# Patient Record
Sex: Female | Born: 1980 | Race: White | Hispanic: No | Marital: Single | Smoking: Current every day smoker
Health system: Southern US, Community
[De-identification: ages and names within clinical notes are randomized; demographics above are authoritative.]

## PROBLEM LIST (undated history)

## (undated) DIAGNOSIS — R51 Headache: Secondary | ICD-10-CM

## (undated) HISTORY — PX: NO PAST SURGERIES: SHX2092

---
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
## 2002-07-29 ENCOUNTER — Inpatient Hospital Stay (HOSPITAL_COMMUNITY): Admission: AD | Admit: 2002-07-29 | Discharge: 2002-07-29 | Payer: Self-pay | Admitting: Obstetrics and Gynecology

## 2003-03-21 ENCOUNTER — Inpatient Hospital Stay (HOSPITAL_COMMUNITY): Admission: AD | Admit: 2003-03-21 | Discharge: 2003-03-21 | Payer: Self-pay | Admitting: Family Medicine

## 2003-10-30 ENCOUNTER — Emergency Department (HOSPITAL_COMMUNITY): Admission: EM | Admit: 2003-10-30 | Discharge: 2003-10-30 | Payer: Self-pay | Admitting: Emergency Medicine

## 2003-12-29 ENCOUNTER — Emergency Department (HOSPITAL_COMMUNITY): Admission: EM | Admit: 2003-12-29 | Discharge: 2003-12-29 | Payer: Self-pay | Admitting: *Deleted

## 2004-11-26 ENCOUNTER — Inpatient Hospital Stay (HOSPITAL_COMMUNITY): Admission: AD | Admit: 2004-11-26 | Discharge: 2004-11-26 | Payer: Self-pay | Admitting: Obstetrics and Gynecology

## 2007-04-23 ENCOUNTER — Inpatient Hospital Stay (HOSPITAL_COMMUNITY): Admission: AD | Admit: 2007-04-23 | Discharge: 2007-04-23 | Payer: Self-pay | Admitting: Gynecology

## 2009-03-29 ENCOUNTER — Inpatient Hospital Stay (HOSPITAL_COMMUNITY): Admission: AD | Admit: 2009-03-29 | Discharge: 2009-03-30 | Payer: Self-pay | Admitting: Obstetrics and Gynecology

## 2011-01-12 LAB — WET PREP, GENITAL: Trich, Wet Prep: NONE SEEN

## 2011-01-12 LAB — CBC
HCT: 42 % (ref 36.0–46.0)
MCHC: 35.1 g/dL (ref 30.0–36.0)
MCV: 91.1 fL (ref 78.0–100.0)
MCV: 92.3 fL (ref 78.0–100.0)
Platelets: 167 10*3/uL (ref 150–400)
Platelets: 178 10*3/uL (ref 150–400)
RDW: 14.3 % (ref 11.5–15.5)
WBC: 10.8 10*3/uL — ABNORMAL HIGH (ref 4.0–10.5)

## 2011-01-12 LAB — URINE MICROSCOPIC-ADD ON

## 2011-01-12 LAB — DIFFERENTIAL
Basophils Relative: 0 % (ref 0–1)
Eosinophils Absolute: 0 10*3/uL (ref 0.0–0.7)
Eosinophils Relative: 0 % (ref 0–5)
Neutrophils Relative %: 84 % — ABNORMAL HIGH (ref 43–77)

## 2011-01-12 LAB — URINALYSIS, ROUTINE W REFLEX MICROSCOPIC
Glucose, UA: 250 mg/dL — AB
Ketones, ur: 15 mg/dL — AB
Leukocytes, UA: NEGATIVE
Nitrite: NEGATIVE
Protein, ur: 100 mg/dL — AB
Specific Gravity, Urine: >1.03 — ABNORMAL HIGH (ref 1.005–1.030)
Urobilinogen, UA: >8 mg/dL — ABNORMAL HIGH (ref 0.0–1.0)
pH: 5.5 (ref 5.0–8.0)

## 2011-01-12 LAB — URINE CULTURE

## 2011-01-12 LAB — POCT PREGNANCY, URINE: Preg Test, Ur: NEGATIVE

## 2011-01-29 ENCOUNTER — Emergency Department (HOSPITAL_COMMUNITY)
Admission: EM | Admit: 2011-01-29 | Discharge: 2011-01-29 | Disposition: A | Discharge status: Home / Self Care | Payer: Self-pay | Attending: Emergency Medicine | Admitting: Emergency Medicine

## 2011-01-29 DIAGNOSIS — K089 Disorder of teeth and supporting structures, unspecified: Secondary | ICD-10-CM | POA: Insufficient documentation

## 2011-01-29 DIAGNOSIS — R220 Localized swelling, mass and lump, head: Secondary | ICD-10-CM | POA: Insufficient documentation

## 2011-02-16 ENCOUNTER — Other Ambulatory Visit (HOSPITAL_COMMUNITY): Payer: Self-pay | Admitting: Pediatrics

## 2011-02-16 ENCOUNTER — Other Ambulatory Visit (HOSPITAL_COMMUNITY): Payer: Self-pay

## 2011-02-17 NOTE — Discharge Summary (Signed)
Vanessa Beck, Vanessa Beck              ACCOUNT NO.:  [id number]   MEDICAL RECORD NO.:  [id number]          PATIENT TYPE:  INP   LOCATION:  9309                          FACILITY:  [facility]   PHYSICIAN:  Lesly Dukes, M.D. DATE OF BIRTH:  [birth year]   DATE OF ADMISSION:  03/29/2009  DATE OF DISCHARGE:  03/30/2009                               DISCHARGE SUMMARY   DISCHARGE DIAGNOSIS:  Viral syndrome with dehydration.   HOSPITAL COURSE:  The patient is a 30 year old female who presented with  low-grade fever, blood in her urine, malaise, abdominal pain.  The  patient was admitted on March 29, 2009, with low-grade fever, backache,  abdominal pain, malaise.  The patient had mild leukocytosis with a white  blood cell count 13,600, 15 ketones in her urine.  The patient was  admitted, given IV hydration and Phenergan.  The patient did very well.  Overnight, she has been afebrile since admission with T-max 99.5.  She  was tolerating food, ambulating, and urinating without discomfort.  She  has no back pain or abdominal pain.  The patient is not pregnant.   LABORATORY DATA:  White blood cell count on discharge 10.8.   PAST MEDICAL HISTORY:  None.   PAST SURGICAL HISTORY:  None.   OTHER TEST RESULTS:  Chest x-ray negative.  Wet prep, many clue cells  and few white blood cells.  Hemoglobin 14.7 and platelets 178.  UPT  negative.   DISCHARGE STATUS:  Improved.   DISCHARGE INSTRUCTIONS:  1. Rest over the weekend.  2. Keep hydrated.  3. Return to urgent care on Monday if not feeling well or fever      returns.  4. Tylenol for fever and pain.      Lesly Dukes, M.D.  Electronically Signed     KHL/MEDQ  D:  03/30/2009  T:  03/31/2009  Job:  [id number]

## 2011-03-26 ENCOUNTER — Emergency Department (HOSPITAL_COMMUNITY): Payer: Self-pay

## 2011-03-26 ENCOUNTER — Emergency Department (HOSPITAL_COMMUNITY)
Admission: EM | Admit: 2011-03-26 | Discharge: 2011-03-26 | Disposition: A | Discharge status: Home / Self Care | Payer: Self-pay | Attending: Emergency Medicine | Admitting: Emergency Medicine

## 2011-03-26 DIAGNOSIS — M79609 Pain in unspecified limb: Secondary | ICD-10-CM | POA: Insufficient documentation

## 2011-03-26 LAB — GLUCOSE, CAPILLARY: Glucose-Capillary: 130 mg/dL — ABNORMAL HIGH (ref 70–99)

## 2011-03-30 ENCOUNTER — Emergency Department (HOSPITAL_COMMUNITY): Payer: Self-pay

## 2011-03-30 ENCOUNTER — Emergency Department (HOSPITAL_COMMUNITY)
Admission: EM | Admit: 2011-03-30 | Discharge: 2011-03-30 | Disposition: A | Discharge status: Home / Self Care | Payer: Self-pay | Attending: Emergency Medicine | Admitting: Emergency Medicine

## 2011-03-30 DIAGNOSIS — M79609 Pain in unspecified limb: Secondary | ICD-10-CM | POA: Insufficient documentation

## 2011-03-30 DIAGNOSIS — M7750 Other enthesopathy of unspecified foot: Secondary | ICD-10-CM | POA: Insufficient documentation

## 2011-04-03 ENCOUNTER — Emergency Department (HOSPITAL_COMMUNITY)
Admission: EM | Admit: 2011-04-03 | Discharge: 2011-04-03 | Disposition: A | Discharge status: Nursing Home (Includes ICF) | Payer: Self-pay | Attending: Emergency Medicine | Admitting: Emergency Medicine

## 2011-04-03 DIAGNOSIS — M79609 Pain in unspecified limb: Secondary | ICD-10-CM | POA: Insufficient documentation

## 2011-04-03 DIAGNOSIS — M7750 Other enthesopathy of unspecified foot: Secondary | ICD-10-CM | POA: Insufficient documentation

## 2011-04-10 ENCOUNTER — Emergency Department (HOSPITAL_COMMUNITY)
Admission: EM | Admit: 2011-04-10 | Discharge: 2011-04-11 | Disposition: A | Discharge status: Home / Self Care | Payer: Self-pay | Attending: Emergency Medicine | Admitting: Emergency Medicine

## 2011-04-10 DIAGNOSIS — R413 Other amnesia: Secondary | ICD-10-CM

## 2011-04-10 DIAGNOSIS — R112 Nausea with vomiting, unspecified: Secondary | ICD-10-CM | POA: Insufficient documentation

## 2011-04-10 DIAGNOSIS — M79609 Pain in unspecified limb: Secondary | ICD-10-CM | POA: Insufficient documentation

## 2011-04-10 DIAGNOSIS — S069X9A Unspecified intracranial injury with loss of consciousness of unspecified duration, initial encounter: Secondary | ICD-10-CM

## 2011-04-10 DIAGNOSIS — F17200 Nicotine dependence, unspecified, uncomplicated: Secondary | ICD-10-CM | POA: Insufficient documentation

## 2011-04-10 LAB — URINALYSIS, ROUTINE W REFLEX MICROSCOPIC
Bilirubin Urine: NEGATIVE
Hgb urine dipstick: NEGATIVE
Ketones, ur: 40 mg/dL — AB
Nitrite: NEGATIVE
Protein, ur: NEGATIVE mg/dL
Specific Gravity, Urine: 1.022 (ref 1.005–1.030)
Urobilinogen, UA: 2 mg/dL — ABNORMAL HIGH (ref 0.0–1.0)

## 2011-04-10 LAB — POCT I-STAT, CHEM 8
Calcium, Ion: 1.07 mmol/L — ABNORMAL LOW (ref 1.12–1.32)
Chloride: 107 mEq/L (ref 96–112)
Glucose, Bld: 113 mg/dL — ABNORMAL HIGH (ref 70–99)
HCT: 45 % (ref 36.0–46.0)
Hemoglobin: 15.3 g/dL — ABNORMAL HIGH (ref 12.0–15.0)
TCO2: 18 mmol/L (ref 0–100)

## 2011-04-10 LAB — POCT PREGNANCY, URINE: Preg Test, Ur: NEGATIVE

## 2011-04-15 ENCOUNTER — Emergency Department (HOSPITAL_COMMUNITY)
Admission: EM | Admit: 2011-04-15 | Discharge: 2011-04-16 | Discharge status: Against Medical Advice | Payer: Self-pay | Attending: Emergency Medicine | Admitting: Emergency Medicine

## 2011-07-20 LAB — URINALYSIS, ROUTINE W REFLEX MICROSCOPIC
Bilirubin Urine: NEGATIVE
Glucose, UA: NEGATIVE
Ketones, ur: 15 — AB
Nitrite: NEGATIVE
Specific Gravity, Urine: 1.025
pH: 7

## 2011-07-20 LAB — WET PREP, GENITAL
Trich, Wet Prep: NONE SEEN
Yeast Wet Prep HPF POC: NONE SEEN

## 2011-07-20 LAB — URINE MICROSCOPIC-ADD ON

## 2011-07-20 LAB — GC/CHLAMYDIA PROBE AMP, GENITAL: GC Probe Amp, Genital: NEGATIVE

## 2012-05-17 ENCOUNTER — Encounter (HOSPITAL_COMMUNITY): Payer: Self-pay | Admitting: *Deleted

## 2012-05-17 ENCOUNTER — Emergency Department (HOSPITAL_COMMUNITY)
Admission: EM | Admit: 2012-05-17 | Discharge: 2012-05-17 | Disposition: A | Discharge status: Home / Self Care | Payer: Self-pay | Attending: Emergency Medicine | Admitting: Emergency Medicine

## 2012-05-17 DIAGNOSIS — F17200 Nicotine dependence, unspecified, uncomplicated: Secondary | ICD-10-CM | POA: Insufficient documentation

## 2012-05-17 DIAGNOSIS — K047 Periapical abscess without sinus: Secondary | ICD-10-CM | POA: Insufficient documentation

## 2012-05-17 MED ORDER — PENICILLIN V POTASSIUM 500 MG PO TABS: 500.0000 mg | ORAL_TABLET | Freq: Four times a day (QID) | ORAL | Status: AC

## 2012-05-17 MED ORDER — HYDROCODONE-ACETAMINOPHEN 5-325 MG PO TABS: 2.0000 | ORAL_TABLET | ORAL | Status: AC | PRN

## 2012-05-17 NOTE — ED Provider Notes (Signed)
History    This chart was scribed for Rolan Bucco, MD, MD by Smitty Pluck. The patient was seen in room TR05C and the patient's care was started at 3:03PM.   CSN: [id number]  Arrival date & time 05/17/12  1233   First MD Initiated Contact with Patient 05/17/12 1455      Chief Complaint  Patient presents with  . Dental Pain    (Consider location/radiation/quality/duration/timing/severity/associated sxs/prior treatment) Patient is a 31 y.o. female presenting with tooth pain. The history is provided by the patient.  Dental PainPrimary symptoms do not include fever.   Vanessa Beck is a 31 y.o. female who presents to the Emergency Department complaining of constant, moderate bottom right dental pain onset 1 day ago. Pt reports that she thinks it is her wisdom teeth coming in. Denies any other pain. Denies fevers, chills and n/v/d.   History reviewed. No pertinent past medical history.  History reviewed. No pertinent past surgical history.  No family history on file.  History  Substance Use Topics  . Smoking status: Current Everyday Smoker  . Smokeless tobacco: Not on file  . Alcohol Use: No    OB History    Grav Para Term Preterm Abortions TAB SAB Ect Mult Living                  Review of Systems  Constitutional: Negative for fever and chills.  HENT: Positive for dental problem.   Gastrointestinal: Negative for nausea and vomiting.  Neurological: Negative for syncope and weakness.    Allergies  Review of patient's allergies indicates no known allergies.  Home Medications   Current Outpatient Rx  Name Route Sig Dispense Refill  . ACETAMINOPHEN 325 MG PO TABS Oral Take 650 mg by mouth every 6 (six) hours as needed. For pain    . HYDROCODONE-ACETAMINOPHEN 5-325 MG PO TABS Oral Take 2 tablets by mouth every 4 (four) hours as needed for pain. 15 tablet 0  . PENICILLIN V POTASSIUM 500 MG PO TABS Oral Take 1 tablet (500 mg total) by mouth 4 (four) times daily. 40  tablet 0    BP 136/94  Pulse 77  Temp 98.5 F (36.9 C) (Oral)  Resp 18  SpO2 100%  LMP 05/17/2012  Physical Exam  Nursing note and vitals reviewed. Constitutional: She is oriented to person, place, and time. She appears well-developed and well-nourished.  HENT:  Beck: Normocephalic and atraumatic.       Mild swelling and tenderness lower back right molar  No trismus   Neck: Neck supple. No tracheal deviation present.  Pulmonary/Chest: Effort normal. No respiratory distress.  Neurological: She is alert and oriented to person, place, and time.  Skin: Skin is warm and dry.  Psychiatric: She has a normal mood and affect. Her behavior is normal.    ED Course  Procedures (including critical care time) DIAGNOSTIC STUDIES: Oxygen Saturation is 100% on room air, normal by my interpretation.    COORDINATION OF CARE: 3:06PM EDP discusses pt ED treatment with pt     Labs Reviewed - No data to display No results found.   1. Periapical abscess       MDM  Pt given abx, pain meds, encouraged to f/u with dentist      I personally performed the services described in this documentation, which was scribed in my presence.  The recorded information has been reviewed and considered.    Rolan Bucco, MD 05/17/12 2009

## 2012-05-17 NOTE — ED Notes (Signed)
Pt is here with right lower mouth/teeth pain and thinks her wisdom teeth are coming in

## 2012-11-03 ENCOUNTER — Encounter (HOSPITAL_COMMUNITY): Payer: Self-pay

## 2012-11-03 ENCOUNTER — Emergency Department (HOSPITAL_COMMUNITY): Payer: Self-pay

## 2012-11-03 ENCOUNTER — Emergency Department (HOSPITAL_COMMUNITY)
Admission: EM | Admit: 2012-11-03 | Discharge: 2012-11-03 | Disposition: A | Discharge status: Home / Self Care | Payer: Self-pay | Attending: Emergency Medicine | Admitting: Emergency Medicine

## 2012-11-03 DIAGNOSIS — S0100XA Unspecified open wound of scalp, initial encounter: Secondary | ICD-10-CM | POA: Insufficient documentation

## 2012-11-03 DIAGNOSIS — Y92410 Unspecified street and highway as the place of occurrence of the external cause: Secondary | ICD-10-CM | POA: Insufficient documentation

## 2012-11-03 DIAGNOSIS — S0101XA Laceration without foreign body of scalp, initial encounter: Secondary | ICD-10-CM

## 2012-11-03 DIAGNOSIS — F17200 Nicotine dependence, unspecified, uncomplicated: Secondary | ICD-10-CM | POA: Insufficient documentation

## 2012-11-03 DIAGNOSIS — Y9389 Activity, other specified: Secondary | ICD-10-CM | POA: Insufficient documentation

## 2012-11-03 MED ORDER — IBUPROFEN 600 MG PO TABS: 600.0000 mg | ORAL_TABLET | Freq: Four times a day (QID) | ORAL | Status: DC | PRN
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
## 2012-11-03 NOTE — ED Notes (Signed)
NP at bedside.
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
## 2012-11-03 NOTE — ED Notes (Signed)
Pt yelling on phone. No longer seems lethargic. Family at bedside.

## 2012-11-05 NOTE — ED Provider Notes (Signed)
Medical screening examination/treatment/procedure(s) were performed by non-physician practitioner and as supervising physician I was immediately available for consultation/collaboration.   Gavin Pound. Oletta Lamas, MD 11/05/12 [phone]

## 2013-06-14 ENCOUNTER — Emergency Department (HOSPITAL_COMMUNITY)
Admission: EM | Admit: 2013-06-14 | Discharge: 2013-06-14 | Discharge status: Against Medical Advice | Payer: Self-pay | Attending: Emergency Medicine | Admitting: Emergency Medicine

## 2013-06-14 ENCOUNTER — Encounter (HOSPITAL_COMMUNITY): Payer: Self-pay | Admitting: Emergency Medicine

## 2013-06-14 DIAGNOSIS — M545 Low back pain, unspecified: Secondary | ICD-10-CM | POA: Insufficient documentation

## 2013-06-14 DIAGNOSIS — F17200 Nicotine dependence, unspecified, uncomplicated: Secondary | ICD-10-CM | POA: Insufficient documentation

## 2013-06-14 NOTE — ED Notes (Addendum)
Pt c/o severe lower back pain since Monday. Pt reports she is unable to ambulate.  Pt is tearful in triage. Denies injury or urinary symptoms.

## 2013-06-15 ENCOUNTER — Inpatient Hospital Stay (HOSPITAL_COMMUNITY)
Admission: AD | Admit: 2013-06-15 | Discharge: 2013-06-15 | Disposition: A | Discharge status: Home / Self Care | Payer: Self-pay | Source: Ambulatory Visit | Attending: Obstetrics & Gynecology | Admitting: Obstetrics & Gynecology

## 2013-06-15 ENCOUNTER — Encounter (HOSPITAL_COMMUNITY): Payer: Self-pay | Admitting: *Deleted

## 2013-06-15 DIAGNOSIS — R1084 Generalized abdominal pain: Secondary | ICD-10-CM

## 2013-06-15 DIAGNOSIS — R109 Unspecified abdominal pain: Secondary | ICD-10-CM | POA: Insufficient documentation

## 2013-06-15 DIAGNOSIS — S39012A Strain of muscle, fascia and tendon of lower back, initial encounter: Secondary | ICD-10-CM

## 2013-06-15 DIAGNOSIS — M549 Dorsalgia, unspecified: Secondary | ICD-10-CM | POA: Insufficient documentation

## 2013-06-15 DIAGNOSIS — M62838 Other muscle spasm: Secondary | ICD-10-CM | POA: Insufficient documentation

## 2013-06-15 DIAGNOSIS — M545 Low back pain: Secondary | ICD-10-CM

## 2013-06-15 HISTORY — DX: Headache: R51

## 2013-06-15 LAB — URINALYSIS, ROUTINE W REFLEX MICROSCOPIC
Bilirubin Urine: NEGATIVE
Hgb urine dipstick: NEGATIVE
Nitrite: NEGATIVE
Specific Gravity, Urine: <1.005 — ABNORMAL LOW (ref 1.005–1.030)
Urobilinogen, UA: 0.2 mg/dL (ref 0.0–1.0)
pH: 6.5 (ref 5.0–8.0)

## 2013-06-15 LAB — URINE MICROSCOPIC-ADD ON

## 2013-06-15 MED ORDER — CYCLOBENZAPRINE HCL 10 MG PO TABS: 10.0000 mg | ORAL_TABLET | Freq: Three times a day (TID) | ORAL | Status: DC | PRN

## 2013-06-15 NOTE — MAU Provider Note (Cosign Needed)
History     CSN: [id number]  Arrival date and time: 06/15/13 1158   None     Chief Complaint  Patient presents with  . Back Pain  . Abdominal Pain   HPI Vanessa Beck is a 32yo F who presents to the MAU with severe back pain over the past 4 days. This is described as a constant "pressure" that involves the lower back (L>R), abdomen, and thighs. The pain is worse with movement, standing, laughter. Tylenol failed to relieve the pain, though a heating pad helped some. She denies recent illnesses, trauma, and has never had this before; however, did describe having picked up her 33 year-old nephew the day before.  Past Medical History  Diagnosis Date  . UVOZDGUY(403.4)     Past Surgical History  Procedure Laterality Date  . No past surgeries      Family History  Problem Relation Age of Onset  . Hypertension Mother   . Diabetes Father     History  Substance Use Topics  . Smoking status: Current Every Day Smoker -- 0.50 packs/day  . Smokeless tobacco: Not on file  . Alcohol Use: No    Allergies: No Known Allergies  Prescriptions prior to admission  Medication Sig Dispense Refill  . ibuprofen (ADVIL,MOTRIN) 600 MG tablet Take 1 tablet (600 mg total) by mouth every 6 (six) hours as needed for pain.  20 tablet  0    Review of Systems  Constitutional: Negative for fever, chills and diaphoresis.  Eyes: Negative for blurred vision.  Respiratory: Negative for shortness of breath.   Cardiovascular: Negative for chest pain.  Gastrointestinal: Negative for nausea, vomiting, abdominal pain, diarrhea and constipation.  Genitourinary: Negative for dysuria, urgency, frequency and hematuria.  Musculoskeletal: Positive for back pain (lower (L>R)).   Physical Exam   Blood pressure 129/71, pulse 71, temperature 98.1 F (36.7 C), temperature source Oral, resp. rate 18, height 5\' 5"  (1.651 m), weight 87.091 kg (192 lb), last menstrual period 06/02/2013, SpO2 99.00%.  Physical  Exam  Constitutional: She appears well-developed and well-nourished. She appears distressed.  GI: Soft. She exhibits no mass. There is tenderness (mild TTP LLQ & LRQ). There is no guarding.  Musculoskeletal: She exhibits tenderness (TTP lower left back. ).  Patient responded well to manual manipulation of the spine.   MAU Course  Procedures  Results for orders placed during the hospital encounter of 06/15/13 (from the past 24 hour(s))  URINALYSIS, ROUTINE W REFLEX MICROSCOPIC     Status: Abnormal   Collection Time    06/15/13 12:15 PM      Result Value Range   Color, Urine YELLOW  YELLOW   APPearance CLEAR  CLEAR   Specific Gravity, Urine <1.005 (*) 1.005 - 1.030   pH 6.5  5.0 - 8.0   Glucose, UA NEGATIVE  NEGATIVE mg/dL   Hgb urine dipstick NEGATIVE  NEGATIVE   Bilirubin Urine NEGATIVE  NEGATIVE   Ketones, ur NEGATIVE  NEGATIVE mg/dL   Protein, ur NEGATIVE  NEGATIVE mg/dL   Urobilinogen, UA 0.2  0.0 - 1.0 mg/dL   Nitrite NEGATIVE  NEGATIVE   Leukocytes, UA SMALL (*) NEGATIVE  URINE MICROSCOPIC-ADD ON     Status: Abnormal   Collection Time    06/15/13 12:15 PM      Result Value Range   Squamous Epithelial / LPF FEW (*) RARE   WBC, UA 0-2  <3 WBC/hpf   Bacteria, UA RARE  RARE  POCT PREGNANCY, URINE  Status: None   Collection Time    06/15/13 12:33 PM      Result Value Range   Preg Test, Ur NEGATIVE  NEGATIVE     MDM: With her history of lifting up her 60-lb nephew the day before pain, alongside her musculoskeletal-pointing physical exam, I suspect spasm of the paraspinal muscles to be the most likely diagnosis.    Assessment and Plan  *Muscle Spasm -Begin Flexeril -Traction/counter traction Manipulation to spine -Patient educated on proper body mechanics, and how to take care of her back in the near and long term.  Vanessa Beck 06/15/2013, 1:32 PM   I spoke with and examined patient and agree with PA-S's note and plan of care.  Vanessa Scale,  MD Ob Fellow 06/15/2013 7:42 PM

## 2013-06-15 NOTE — MAU Note (Signed)
When pt was called back to a MAU room, she was found outside smoking and talking  on her phone;

## 2013-06-15 NOTE — MAU Note (Signed)
C/o back pain since Monday (past 4 days)- pain started at work ([employer]) but denies any injury or heavy lifting; c/o abd pain also that started with the back pain;

## 2013-06-15 NOTE — MAU Note (Signed)
Patient states she has had increasing back pain for several days. Was at [facility] last night but did not wait to be seen by MD. States she is having abdominal pain as well. Denies nausea,vomiting,vaginal bleeding or discharge.

## 2014-03-15 ENCOUNTER — Emergency Department (HOSPITAL_COMMUNITY)
Admission: EM | Admit: 2014-03-15 | Discharge: 2014-03-15 | Disposition: A | Discharge status: Home / Self Care | Payer: Self-pay | Attending: Emergency Medicine | Admitting: Emergency Medicine

## 2014-03-15 ENCOUNTER — Encounter (HOSPITAL_COMMUNITY): Payer: Self-pay | Admitting: Emergency Medicine

## 2014-03-15 ENCOUNTER — Emergency Department (HOSPITAL_COMMUNITY): Payer: Self-pay

## 2014-03-15 DIAGNOSIS — J40 Bronchitis, not specified as acute or chronic: Secondary | ICD-10-CM

## 2014-03-15 DIAGNOSIS — J209 Acute bronchitis, unspecified: Secondary | ICD-10-CM | POA: Insufficient documentation

## 2014-03-15 DIAGNOSIS — F17200 Nicotine dependence, unspecified, uncomplicated: Secondary | ICD-10-CM | POA: Insufficient documentation

## 2014-03-15 MED ORDER — CETIRIZINE-PSEUDOEPHEDRINE ER 5-120 MG PO TB12: 1.0000 | ORAL_TABLET | Freq: Two times a day (BID) | ORAL | Status: DC

## 2014-03-15 MED ORDER — GUAIFENESIN ER 600 MG PO TB12: 600.0000 mg | ORAL_TABLET | Freq: Two times a day (BID) | ORAL | Status: DC

## 2014-03-15 MED ORDER — ALBUTEROL SULFATE (2.5 MG/3ML) 0.083% IN NEBU
5.0000 mg | INHALATION_SOLUTION | Freq: Once | RESPIRATORY_TRACT | Status: AC
  Administered 2014-03-15: 5 mg via RESPIRATORY_TRACT
  Filled 2014-03-15: qty 6

## 2014-03-15 MED ORDER — AZITHROMYCIN 250 MG PO TABS: ORAL_TABLET | ORAL | Status: DC

## 2014-03-15 MED ORDER — PREDNISONE 20 MG PO TABS
60.0000 mg | ORAL_TABLET | Freq: Once | ORAL | Status: AC
  Administered 2014-03-15: 60 mg via ORAL
  Filled 2014-03-15: qty 3

## 2014-03-15 MED ORDER — ALBUTEROL SULFATE HFA 108 (90 BASE) MCG/ACT IN AERS
2.0000 | INHALATION_SPRAY | RESPIRATORY_TRACT | Status: DC | PRN
  Administered 2014-03-15: 2 via RESPIRATORY_TRACT
  Filled 2014-03-15: qty 6.7

## 2014-03-15 MED ORDER — PREDNISONE 10 MG PO TABS: 20.0000 mg | ORAL_TABLET | Freq: Every day | ORAL | Status: DC

## 2014-03-15 NOTE — Discharge Instructions (Signed)
Please use the medications prescribed to help with your infection and symptoms. Use the albuterol inhaler as instructed by taking 2 puffs every 4 hours as needed for cough and wheezing. Followup with a primary care provider for continued evaluation and treatment.     Bronchitis Bronchitis is inflammation of the airways that extend from the windpipe into the lungs (bronchi). The inflammation often causes mucus to develop, which leads to a cough. If the inflammation becomes severe, it may cause shortness of breath. CAUSES  Bronchitis may be caused by:   Viral infections.   Bacteria.   Cigarette smoke.   Allergens, pollutants, and other irritants.  SIGNS AND SYMPTOMS  The most common symptom of bronchitis is a frequent cough that produces mucus. Other symptoms include:  Fever.   Body aches.   Chest congestion.   Chills.   Shortness of breath.   Sore throat.  DIAGNOSIS  Bronchitis is usually diagnosed through a medical history and physical exam. Tests, such as chest X-rays, are sometimes done to rule out other conditions.  TREATMENT  You may need to avoid contact with whatever caused the problem (smoking, for example). Medicines are sometimes needed. These may include:  Antibiotics. These may be prescribed if the condition is caused by bacteria.  Cough suppressants. These may be prescribed for relief of cough symptoms.   Inhaled medicines. These may be prescribed to help open your airways and make it easier for you to breathe.   Steroid medicines. These may be prescribed for those with recurrent (chronic) bronchitis. HOME CARE INSTRUCTIONS  Get plenty of rest.   Drink enough fluids to keep your urine clear or pale yellow (unless you have a medical condition that requires fluid restriction). Increasing fluids may help thin your secretions and will prevent dehydration.   Only take over-the-counter or prescription medicines as directed by your health care  provider.  Only take antibiotics as directed. Make sure you finish them even if you start to feel better.  Avoid secondhand smoke, irritating chemicals, and strong fumes. These will make bronchitis worse. If you are a smoker, quit smoking. Consider using nicotine gum or skin patches to help control withdrawal symptoms. Quitting smoking will help your lungs heal faster.   Put a cool-mist humidifier in your bedroom at night to moisten the air. This may help loosen mucus. Change the water in the humidifier daily. You can also run the hot water in your shower and sit in the bathroom with the door closed for 5 10 minutes.   Follow up with your health care provider as directed.   Wash your hands frequently to avoid catching bronchitis again or spreading an infection to others.  SEEK MEDICAL CARE IF: Your symptoms do not improve after 1 week of treatment.  SEEK IMMEDIATE MEDICAL CARE IF:  Your fever increases.  You have chills.   You have chest pain.   You have worsening shortness of breath.   You have bloody sputum.  You faint.  You have lightheadedness.  You have a severe headache.   You vomit repeatedly. MAKE SURE YOU:   Understand these instructions.  Will watch your condition.  Will get help right away if you are not doing well or get worse. Document Released: 09/21/2005 Document Revised: 07/12/2013 Document Reviewed: 05/16/2013 [facility] Patient Information 2014 [place], [place].

## 2014-03-15 NOTE — ED Notes (Signed)
Declined W/C at D/C and was escorted to lobby by RN. 

## 2014-03-15 NOTE — ED Notes (Signed)
Pt transported to Xray. 

## 2014-03-15 NOTE — ED Provider Notes (Signed)
CSN: [id number]     Arrival date & time 03/15/14  1641 History  This chart was scribed for Ivonne Andrew, PA working with Vanetta Mulders, MD by Evon Slack, ED Scribe. This patient was seen in room TR11C/TR11C and the patient's care was started at 6:30 PM.      Chief Complaint  Patient presents with  . URI   The history is provided by the patient. No language interpreter was used.   HPI Comments: Vanessa Beck is a 33 y.o. female who presents to the Emergency Department complaining of uri onset 4 days. She states she has associated fever, cough productive of green sputum, fatigue, chills, rhinorrhea, ear pain. She states she has taken ibuprofen with no improvement of her symptoms. States she has recently been in contact with her sick sister and co workers. She denies any other related symptoms. She states that she is a current smoker of .50 ppd. She denies having a h/o of respiratory issues.     Past Medical History  Diagnosis Date  . JASNKNLZ(767.3)    Past Surgical History  Procedure Laterality Date  . No past surgeries     Family History  Problem Relation Age of Onset  . Hypertension Mother   . Diabetes Father    History  Substance Use Topics  . Smoking status: Current Every Day Smoker -- 0.50 packs/day  . Smokeless tobacco: Not on file  . Alcohol Use: No   OB History   Grav Para Term Preterm Abortions TAB SAB Ect Mult Living   1 1 1       1      Review of Systems  Constitutional: Positive for chills.  All other systems reviewed and are negative.     Allergies  Review of patient's allergies indicates no known allergies.  Home Medications   Prior to Admission medications   Medication Sig Start Date End Date Taking? Authorizing Provider  Acetaminophen (TYLENOL PO) Take 2 tablets by mouth every 8 (eight) hours as needed (takes for pain).    Historical Provider, MD  cyclobenzaprine (FLEXERIL) 10 MG tablet Take 1 tablet (10 mg total) by mouth 3 (three) times  daily as needed for muscle spasms. 06/15/13   Lesly Dukes, MD   Triage Vitals: BP 131/88  Pulse 86  Temp(Src) 98.3 F (36.8 C) (Oral)  Resp 20  SpO2 98%  LMP 03/14/2014  Physical Exam  Nursing note and vitals reviewed. Constitutional: She is oriented to person, place, and time. She appears well-developed and well-nourished. No distress.  HENT:  Head: Normocephalic and atraumatic.  Right Ear: Tympanic membrane normal.  Left Ear: Tympanic membrane normal.  Mouth/Throat: Oropharynx is clear and moist.  Eyes: Conjunctivae and EOM are normal. Pupils are equal, round, and reactive to light.  Neck: Normal range of motion. No tracheal deviation present.  Cardiovascular: Normal rate, regular rhythm and normal heart sounds.   Pulmonary/Chest: Effort normal. No respiratory distress. She has wheezes. She has no rales.  Musculoskeletal: Normal range of motion.  Neurological: She is alert and oriented to person, place, and time.  Skin: Skin is warm and dry.  Psychiatric: She has a normal mood and affect. Her behavior is normal.    ED Course  Procedures    DIAGNOSTIC STUDIES: Oxygen Saturation is 98% on RA, normal by my interpretation.    COORDINATION OF CARE: 6:38 PM-Discussed treatment plan which includes nebulizer treatment and albuterol inhaler with pt at bedside and pt agreed to plan.  Imaging Review Dg Chest 2 View  03/15/2014   CLINICAL DATA:  Productive cough.  Headache.  EXAM: CHEST  2 VIEW  COMPARISON:  03/30/2009.  FINDINGS: Cardiopericardial silhouette within normal limits. Mediastinal contours normal. Trachea midline. No airspace disease or effusion.  IMPRESSION: No active cardiopulmonary disease.   Electronically Signed   By: Andreas NewportGeoffrey  Lamke M.D.   On: 03/15/2014 18:27      MDM   Final diagnoses:  Bronchitis      I personally performed the services described in this documentation, which was scribed in my presence. The recorded information has been reviewed  and is accurate.     Angus Sellereter S Vicky Mccanless, PA-C 03/15/14 1844

## 2014-03-15 NOTE — ED Notes (Addendum)
Pt c/o rhinitis, cough, fever and ear pain since Sunday.  Pt talking constantly on phone and calling someone a "bitch".  Pt took ibuprofen at 10 am approx.

## 2014-03-15 NOTE — ED Notes (Signed)
Pt tearful during nursing assessment.  Pt reports coughing and runny nose since Sunday.  Reports coughing up green sputum.  Reports fevers.  Does not recall temperature.

## 2014-03-17 NOTE — ED Provider Notes (Signed)
Medical screening examination/treatment/procedure(s) were performed by non-physician practitioner and as supervising physician I was immediately available for consultation/collaboration.   EKG Interpretation None        Archana Eckman, MD 03/17/14 1613 

## 2014-08-06 ENCOUNTER — Encounter (HOSPITAL_COMMUNITY): Payer: Self-pay | Admitting: Emergency Medicine

## 2014-10-01 ENCOUNTER — Inpatient Hospital Stay (HOSPITAL_COMMUNITY)
Admission: AD | Admit: 2014-10-01 | Discharge: 2014-10-02 | Disposition: A | Discharge status: Home / Self Care | Payer: Self-pay | Source: Ambulatory Visit | Attending: Obstetrics & Gynecology | Admitting: Obstetrics & Gynecology

## 2014-10-01 ENCOUNTER — Encounter (HOSPITAL_COMMUNITY): Payer: Self-pay | Admitting: *Deleted

## 2014-10-01 DIAGNOSIS — R1032 Left lower quadrant pain: Secondary | ICD-10-CM | POA: Insufficient documentation

## 2014-10-01 DIAGNOSIS — F17210 Nicotine dependence, cigarettes, uncomplicated: Secondary | ICD-10-CM | POA: Insufficient documentation

## 2014-10-01 DIAGNOSIS — N939 Abnormal uterine and vaginal bleeding, unspecified: Secondary | ICD-10-CM | POA: Insufficient documentation

## 2014-10-01 DIAGNOSIS — R102 Pelvic and perineal pain: Secondary | ICD-10-CM

## 2014-10-01 DIAGNOSIS — N8320 Unspecified ovarian cysts: Secondary | ICD-10-CM | POA: Insufficient documentation

## 2014-10-01 LAB — POCT PREGNANCY, URINE: Preg Test, Ur: NEGATIVE
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
## 2014-10-02 ENCOUNTER — Inpatient Hospital Stay (HOSPITAL_COMMUNITY): Payer: Self-pay

## 2014-10-02 ENCOUNTER — Encounter (HOSPITAL_COMMUNITY): Payer: Self-pay | Admitting: *Deleted

## 2014-10-02 DIAGNOSIS — R102 Pelvic and perineal pain: Secondary | ICD-10-CM

## 2014-10-02 LAB — CBC WITH DIFFERENTIAL/PLATELET
BASOS ABS: 0 10*3/uL (ref 0.0–0.1)
BASOS PCT: 0 % (ref 0–1)
EOS ABS: 0 10*3/uL (ref 0.0–0.7)
EOS PCT: 0 % (ref 0–5)
HCT: 40.4 % (ref 36.0–46.0)
Hemoglobin: 14 g/dL (ref 12.0–15.0)
Lymphocytes Relative: 15 % (ref 12–46)
Lymphs Abs: 2 10*3/uL (ref 0.7–4.0)
MCH: 32.7 pg (ref 26.0–34.0)
MCHC: 34.7 g/dL (ref 30.0–36.0)
MCV: 94.4 fL (ref 78.0–100.0)
Monocytes Absolute: 0.6 10*3/uL (ref 0.1–1.0)
Monocytes Relative: 4 % (ref 3–12)
Neutro Abs: 10.6 10*3/uL — ABNORMAL HIGH (ref 1.7–7.7)
Neutrophils Relative %: 81 % — ABNORMAL HIGH (ref 43–77)
PLATELETS: 296 10*3/uL (ref 150–400)
RBC: 4.28 MIL/uL (ref 3.87–5.11)
RDW: 13.6 % (ref 11.5–15.5)
WBC: 13.2 10*3/uL — ABNORMAL HIGH (ref 4.0–10.5)

## 2014-10-02 LAB — URINALYSIS, DIPSTICK ONLY
BILIRUBIN URINE: NEGATIVE
GLUCOSE, UA: NEGATIVE mg/dL
KETONES UR: NEGATIVE mg/dL
Nitrite: NEGATIVE
PH: 6 (ref 5.0–8.0)
Protein, ur: 30 mg/dL — AB
Specific Gravity, Urine: 1.015 (ref 1.005–1.030)
Urobilinogen, UA: 0.2 mg/dL (ref 0.0–1.0)

## 2014-10-02 LAB — GC/CHLAMYDIA PROBE AMP
CT PROBE, AMP APTIMA: NEGATIVE
GC PROBE AMP APTIMA: POSITIVE — AB

## 2014-10-02 LAB — HIV ANTIBODY (ROUTINE TESTING W REFLEX): HIV: NONREACTIVE

## 2014-10-02 LAB — WET PREP, GENITAL
Trich, Wet Prep: NONE SEEN
YEAST WET PREP: NONE SEEN

## 2014-10-02 MED ORDER — MEGESTROL ACETATE 40 MG PO TABS: ORAL_TABLET | ORAL | Status: DC

## 2014-10-02 MED ORDER — KETOROLAC TROMETHAMINE 60 MG/2ML IM SOLN
60.0000 mg | Freq: Once | INTRAMUSCULAR | Status: AC
  Administered 2014-10-02: 60 mg via INTRAMUSCULAR
  Filled 2014-10-02: qty 2

## 2014-10-02 MED ORDER — METRONIDAZOLE 500 MG PO TABS: 500.0000 mg | ORAL_TABLET | Freq: Two times a day (BID) | ORAL | Status: DC

## 2014-10-02 MED ORDER — OXYCODONE-ACETAMINOPHEN 5-325 MG PO TABS: 1.0000 | ORAL_TABLET | Freq: Four times a day (QID) | ORAL | Status: DC | PRN

## 2014-10-02 MED ORDER — DOXYCYCLINE HYCLATE 100 MG PO CAPS: 100.0000 mg | ORAL_CAPSULE | Freq: Two times a day (BID) | ORAL | Status: DC

## 2014-10-02 MED ORDER — HYDROMORPHONE HCL 1 MG/ML IJ SOLN
1.0000 mg | Freq: Once | INTRAMUSCULAR | Status: AC
  Administered 2014-10-02: 1 mg via INTRAMUSCULAR
  Filled 2014-10-02: qty 1

## 2014-10-02 NOTE — Discharge Instructions (Signed)
Abnormal Uterine Bleeding Abnormal uterine bleeding can affect women at various stages in life, including teenagers, women in their reproductive years, pregnant women, and women who have reached menopause. Several kinds of uterine bleeding are considered abnormal, including:  Bleeding or spotting between periods.   Bleeding after sexual intercourse.   Bleeding that is heavier or more than normal.   Periods that last longer than usual.  Bleeding after menopause.  Many cases of abnormal uterine bleeding are minor and simple to treat, while others are more serious. Any type of abnormal bleeding should be evaluated by your health care provider. Treatment will depend on the cause of the bleeding. HOME CARE INSTRUCTIONS Monitor your condition for any changes. The following actions may help to alleviate any discomfort you are experiencing:  Avoid the use of tampons and douches as directed by your health care provider.  Change your pads frequently. You should get regular pelvic exams and Pap tests. Keep all follow-up appointments for diagnostic tests as directed by your health care provider.  SEEK MEDICAL CARE IF:   Your bleeding lasts more than 1 week.   You feel dizzy at times.  SEEK IMMEDIATE MEDICAL CARE IF:   You pass out.   You are changing pads every 15 to 30 minutes.   You have abdominal pain.  You have a fever.   You become sweaty or weak.   You are passing large blood clots from the vagina.   You start to feel nauseous and vomit. MAKE SURE YOU:   Understand these instructions.  Will watch your condition.  Will get help right away if you are not doing well or get worse. Document Released: 09/21/2005 Document Revised: 09/26/2013 Document Reviewed: 04/20/2013 Exit[employer] Patient Information 2015 [employer]. This information is not intended to replace advice given to you by your health care provider. Make sure you discuss any questions you have with your  health care provider.  

## 2014-10-02 NOTE — MAU Provider Note (Signed)
History     CSN: [id number]  Arrival date and time: 10/01/14 2319   First Provider Initiated Contact with Patient 10/02/14 0015      No chief complaint on file.  HPI  Ms. Vanessa Beck is a 33 y.o. G1P1001 who presents to MAU today with complaint of LLQ pain and vaginal bleeding. The patient states normal LMP 09/17/14. She states that she noted constipation recently and took a laxative yesterday. She had a normal BM after that. Overnight last night she noted new onset vaginal bleeding. She states that the bleeding has been heavy. She also has LLQ pain that she rates at 8/10 now. She denies N/V/D or fever. She is not on birth control. She denies a history of irregular periods.   OB History    Gravida Para Term Preterm AB TAB SAB Ectopic Multiple Living   1 1 1       1       Past Medical History  Diagnosis Date  . WUJWJXBJ(478.2)     Past Surgical History  Procedure Laterality Date  . No past surgeries      Family History  Problem Relation Age of Onset  . Hypertension Mother   . Diabetes Father     History  Substance Use Topics  . Smoking status: Current Every Day Smoker -- 0.50 packs/day  . Smokeless tobacco: Not on file  . Alcohol Use: Yes    Allergies: No Known Allergies  Prescriptions prior to admission  Medication Sig Dispense Refill Last Dose  . acetaminophen (TYLENOL) 325 MG tablet Take 650 mg by mouth every 4 (four) hours as needed.     Marland Kitchen azithromycin (ZITHROMAX Z-PAK) 250 MG tablet Take 2 tabs on day one. Take 1 tab on days 2-5 6 tablet 0   . cetirizine-pseudoephedrine (ZYRTEC-D) 5-120 MG per tablet Take 1 tablet by mouth 2 (two) times daily. 30 tablet 0   . guaiFENesin (MUCINEX) 600 MG 12 hr tablet Take 1 tablet (600 mg total) by mouth 2 (two) times daily. 30 tablet 0   . ibuprofen (ADVIL,MOTRIN) 200 MG tablet Take 400 mg by mouth every 6 (six) hours as needed for mild pain.   03/14/2014 at Unknown time  . predniSONE (DELTASONE) 10 MG tablet Take 2  tablets (20 mg total) by mouth daily. 15 tablet 0     Review of Systems  Constitutional: Negative for fever and malaise/fatigue.  Gastrointestinal: Positive for abdominal pain and constipation. Negative for nausea, vomiting and diarrhea.  Genitourinary: Negative for dysuria, urgency, frequency, hematuria and flank pain.       + vaginal bleeding Neg - vaginal discharge  Musculoskeletal: Positive for back pain.   Physical Exam   Blood pressure 124/94, pulse 93, temperature 99.8 F (37.7 C), temperature source Oral, resp. rate 20, height 5\' 3"  (1.6 m), weight 197 lb (89.359 kg), last menstrual period 09/17/2014, SpO2 100 %.  Physical Exam  Constitutional: She is oriented to person, place, and time. She appears well-developed and well-nourished. No distress.  Appears uncomfortable and tearful  HENT:  Head: Normocephalic.  Cardiovascular: Normal rate.   Respiratory: Effort normal.  GI: Soft. She exhibits no distension and no mass. There is no tenderness. There is no rebound, no guarding and no CVA tenderness.  Genitourinary: Uterus is not enlarged and not tender. Cervix exhibits no motion tenderness, no discharge and no friability. Right adnexum displays no mass and no tenderness. Left adnexum displays no mass and no tenderness. There is bleeding (small amount  of blood in the vaginal vault) in the vagina. No vaginal discharge found.  Neurological: She is alert and oriented to person, place, and time.  Skin: Skin is warm and dry. No erythema.  Psychiatric: She has a normal mood and affect.   Results for orders placed or performed during the hospital encounter of 10/01/14 (from the past 24 hour(s))  Urinalysis, dipstick only     Status: Abnormal   Collection Time: 10/01/14 11:42 PM  Result Value Ref Range   Specific Gravity, Urine 1.015 1.005 - 1.030   pH 6.0 5.0 - 8.0   Glucose, UA NEGATIVE NEGATIVE mg/dL   Hgb urine dipstick LARGE (A) NEGATIVE   Bilirubin Urine NEGATIVE NEGATIVE    Ketones, ur NEGATIVE NEGATIVE mg/dL   Protein, ur 30 (A) NEGATIVE mg/dL   Urobilinogen, UA 0.2 0.0 - 1.0 mg/dL   Nitrite NEGATIVE NEGATIVE   Leukocytes, UA TRACE (A) NEGATIVE  Pregnancy, urine POC     Status: None   Collection Time: 10/01/14 11:54 PM  Result Value Ref Range   Preg Test, Ur NEGATIVE NEGATIVE  CBC with Differential     Status: Abnormal   Collection Time: 10/01/14 11:59 PM  Result Value Ref Range   WBC 13.2 (H) 4.0 - 10.5 K/uL   RBC 4.28 3.87 - 5.11 MIL/uL   Hemoglobin 14.0 12.0 - 15.0 g/dL   HCT 16.140.4 09.636.0 - 04.546.0 %   MCV 94.4 78.0 - 100.0 fL   MCH 32.7 26.0 - 34.0 pg   MCHC 34.7 30.0 - 36.0 g/dL   RDW 40.913.6 81.111.5 - 91.415.5 %   Platelets 296 150 - 400 K/uL   Neutrophils Relative % 81 (H) 43 - 77 %   Neutro Abs 10.6 (H) 1.7 - 7.7 K/uL   Lymphocytes Relative 15 12 - 46 %   Lymphs Abs 2.0 0.7 - 4.0 K/uL   Monocytes Relative 4 3 - 12 %   Monocytes Absolute 0.6 0.1 - 1.0 K/uL   Eosinophils Relative 0 0 - 5 %   Eosinophils Absolute 0.0 0.0 - 0.7 K/uL   Basophils Relative 0 0 - 1 %   Basophils Absolute 0.0 0.0 - 0.1 K/uL  Wet prep, genital     Status: Abnormal   Collection Time: 10/02/14 12:20 AM  Result Value Ref Range   Yeast Wet Prep HPF POC NONE SEEN NONE SEEN   Trich, Wet Prep NONE SEEN NONE SEEN   Clue Cells Wet Prep HPF POC FEW (A) NONE SEEN   WBC, Wet Prep HPF POC FEW (A) NONE SEEN   [place] Transvaginal Non-ob  10/02/2014   CLINICAL DATA:  Pelvic pain, abnormal uterine bleeding  EXAM: TRANSABDOMINAL AND TRANSVAGINAL ULTRASOUND OF PELVIS  TECHNIQUE: Both transabdominal and transvaginal ultrasound examinations of the pelvis were performed. Transabdominal technique was performed for global imaging of the pelvis including uterus, ovaries, adnexal regions, and pelvic cul-de-sac. It was necessary to proceed with endovaginal exam following the transabdominal exam to visualize the endometrium and bilateral ovaries.  COMPARISON:  None  FINDINGS: Uterus  Measurements: 8.6 x 4.4 x  5.0 cm. No fibroids or other mass visualized.  Endometrium  Thickness: 8 mm.  No focal abnormality visualized.  Right ovary  Measurements: 2.6 x 2.2 x 2.5 cm. Normal appearance/no adnexal mass.  Left ovary  Measurements: 4.3 x 3.8 x 3.6 cm. Complex cystic lesion, measuring 3.0 x 1.9 x 1.7 cm. Complex tubular structure adjacent to the left ovary, favored to reflect a hydrosalpinx or pyosalpinx.  Other findings  Small volume pelvic ascites.  IMPRESSION: Complex tubular structure adjacent to the left ovary, favored to reflect a hydrosalpinx or pyosalpinx.  3.0 cm complex left ovarian cyst, possibly reflecting a hemorrhagic corpus luteal cyst, tubo-ovarian abscess not excluded.   Electronically Signed   By: Charline BillsSriyesh  Krishnan M.D.   On: 10/02/2014 03:18   [place] Pelvis Complete  10/02/2014   CLINICAL DATA:  Pelvic pain, abnormal uterine bleeding  EXAM: TRANSABDOMINAL AND TRANSVAGINAL ULTRASOUND OF PELVIS  TECHNIQUE: Both transabdominal and transvaginal ultrasound examinations of the pelvis were performed. Transabdominal technique was performed for global imaging of the pelvis including uterus, ovaries, adnexal regions, and pelvic cul-de-sac. It was necessary to proceed with endovaginal exam following the transabdominal exam to visualize the endometrium and bilateral ovaries.  COMPARISON:  None  FINDINGS: Uterus  Measurements: 8.6 x 4.4 x 5.0 cm. No fibroids or other mass visualized.  Endometrium  Thickness: 8 mm.  No focal abnormality visualized.  Right ovary  Measurements: 2.6 x 2.2 x 2.5 cm. Normal appearance/no adnexal mass.  Left ovary  Measurements: 4.3 x 3.8 x 3.6 cm. Complex cystic lesion, measuring 3.0 x 1.9 x 1.7 cm. Complex tubular structure adjacent to the left ovary, favored to reflect a hydrosalpinx or pyosalpinx.  Other findings  Small volume pelvic ascites.  IMPRESSION: Complex tubular structure adjacent to the left ovary, favored to reflect a hydrosalpinx or pyosalpinx.  3.0 cm complex left ovarian cyst,  possibly reflecting a hemorrhagic corpus luteal cyst, tubo-ovarian abscess not excluded.   Electronically Signed   By: Charline BillsSriyesh  Krishnan M.D.   On: 10/02/2014 03:18    MAU Course  Procedures None  MDM UPT- negative UA, wet prep, GC/Chlamydia, CBC and HIV today 60 mg IM Toradol given in MAU - patient states no relief and continues to appear uncomfortable and tearful.  1 mg Dilaudid given IM Pelvic US ordered.  Patient reports resolution of pain Discussed patient and results with Dr. Erin Fullingharraway-Smith. Suggests Rx for Megace, Doxycycline, Flagyl and Percocet. Follow-up with [facility].  Assessment and Plan  A: Abnormal uterine bleeding Complex left ovarian cyst Concern for possible TOA  P: Discharge home Rx for Megace, Doxycycline, Flagyl and Percocet given to patient Bleeding precautions discussed [facility] They will call her with an appointment.  Patient may return to MAU as needed or if her condition were to change or worsen   Marny LowensteinJulie N Ernst [place], PA-C  10/02/2014, 3:48 AM

## 2014-10-22 ENCOUNTER — Encounter: Payer: Self-pay | Admitting: *Deleted

## 2014-10-22 ENCOUNTER — Telehealth: Payer: Self-pay | Admitting: *Deleted

## 2014-10-22 ENCOUNTER — Encounter: Payer: Self-pay | Admitting: Obstetrics and Gynecology

## 2014-10-22 NOTE — Telephone Encounter (Signed)
Vanessa Beck missed  A scheduled appointment for AUB, Ovarian cyst, and possible TOA. Called and left message she missed an appointment- please call and reschedule. Will send letter.

## 2014-12-04 ENCOUNTER — Encounter: Payer: Self-pay | Admitting: General Practice

## 2017-06-01 ENCOUNTER — Inpatient Hospital Stay (HOSPITAL_COMMUNITY): Payer: Self-pay

## 2017-06-01 ENCOUNTER — Inpatient Hospital Stay (HOSPITAL_COMMUNITY)
Admission: AD | Admit: 2017-06-01 | Discharge: 2017-06-01 | Disposition: A | Discharge status: Home / Self Care | Payer: Self-pay | Source: Ambulatory Visit | Attending: Family Medicine | Admitting: Family Medicine

## 2017-06-01 ENCOUNTER — Encounter (HOSPITAL_COMMUNITY): Payer: Self-pay | Admitting: *Deleted

## 2017-06-01 DIAGNOSIS — F17210 Nicotine dependence, cigarettes, uncomplicated: Secondary | ICD-10-CM | POA: Insufficient documentation

## 2017-06-01 DIAGNOSIS — R1032 Left lower quadrant pain: Secondary | ICD-10-CM

## 2017-06-01 DIAGNOSIS — R102 Pelvic and perineal pain: Secondary | ICD-10-CM | POA: Insufficient documentation

## 2017-06-01 DIAGNOSIS — K5792 Diverticulitis of intestine, part unspecified, without perforation or abscess without bleeding: Secondary | ICD-10-CM | POA: Insufficient documentation

## 2017-06-01 LAB — URINALYSIS, ROUTINE W REFLEX MICROSCOPIC
Bilirubin Urine: NEGATIVE
Glucose, UA: NEGATIVE mg/dL
KETONES UR: NEGATIVE mg/dL
Nitrite: NEGATIVE
PH: 5 (ref 5.0–8.0)
Protein, ur: 30 mg/dL — AB
SPECIFIC GRAVITY, URINE: 1.021 (ref 1.005–1.030)

## 2017-06-01 LAB — BASIC METABOLIC PANEL
ANION GAP: 9 (ref 5–15)
BUN: 9 mg/dL (ref 6–20)
CALCIUM: 9 mg/dL (ref 8.9–10.3)
CO2: 25 mmol/L (ref 22–32)
Chloride: 102 mmol/L (ref 101–111)
Creatinine, Ser: 0.76 mg/dL (ref 0.44–1.00)
GFR calc non Af Amer: >60 mL/min (ref >60)
Glucose, Bld: 110 mg/dL — ABNORMAL HIGH (ref 65–99)
POTASSIUM: 3.9 mmol/L (ref 3.5–5.1)
Sodium: 136 mmol/L (ref 135–145)

## 2017-06-01 LAB — DIFFERENTIAL
BASOS ABS: 0 10*3/uL (ref 0.0–0.1)
Basophils Relative: 0 %
EOS ABS: 0 10*3/uL (ref 0.0–0.7)
Eosinophils Relative: 0 %
LYMPHS ABS: 1.4 10*3/uL (ref 0.7–4.0)
LYMPHS PCT: 8 %
Monocytes Absolute: 0.4 10*3/uL (ref 0.1–1.0)
Monocytes Relative: 2 %
NEUTROS ABS: 16 10*3/uL — AB (ref 1.7–7.7)
NEUTROS PCT: 90 %

## 2017-06-01 LAB — CBC
HEMATOCRIT: 44.2 % (ref 36.0–46.0)
HEMOGLOBIN: 15.2 g/dL — AB (ref 12.0–15.0)
MCH: 31.7 pg (ref 26.0–34.0)
MCHC: 34.4 g/dL (ref 30.0–36.0)
MCV: 92.3 fL (ref 78.0–100.0)
Platelets: 283 10*3/uL (ref 150–400)
RBC: 4.79 MIL/uL (ref 3.87–5.11)
RDW: 13.8 % (ref 11.5–15.5)
WBC: 18.4 10*3/uL — AB (ref 4.0–10.5)

## 2017-06-01 LAB — WET PREP, GENITAL
CLUE CELLS WET PREP: NONE SEEN
SPERM: NONE SEEN
TRICH WET PREP: NONE SEEN
YEAST WET PREP: NONE SEEN

## 2017-06-01 LAB — RAPID URINE DRUG SCREEN, HOSP PERFORMED
AMPHETAMINES: NOT DETECTED
BARBITURATES: NOT DETECTED
BENZODIAZEPINES: NOT DETECTED
COCAINE: POSITIVE — AB
Opiates: NOT DETECTED
TETRAHYDROCANNABINOL: POSITIVE — AB

## 2017-06-01 LAB — POCT PREGNANCY, URINE: Preg Test, Ur: NEGATIVE

## 2017-06-01 MED ORDER — IOPAMIDOL (ISOVUE-300) INJECTION 61%
30.0000 mL | INTRAVENOUS | Status: AC
  Administered 2017-06-01 (×2): 30 mL via ORAL

## 2017-06-01 MED ORDER — METRONIDAZOLE 500 MG PO TABS
500.0000 mg | ORAL_TABLET | Freq: Three times a day (TID) | ORAL | 0 refills | Status: AC
Start: 2017-06-01 — End: 2017-06-08

## 2017-06-01 MED ORDER — OXYCODONE-ACETAMINOPHEN 5-325 MG PO TABS: 1.0000 | ORAL_TABLET | Freq: Four times a day (QID) | ORAL | 0 refills | Status: AC | PRN

## 2017-06-01 MED ORDER — HYDROMORPHONE HCL 1 MG/ML IJ SOLN
1.0000 mg | Freq: Once | INTRAMUSCULAR | Status: AC
Start: 2017-06-01 — End: 2017-06-01
  Administered 2017-06-01: 1 mg via INTRAMUSCULAR
  Filled 2017-06-01: qty 1

## 2017-06-01 MED ORDER — CIPROFLOXACIN HCL 500 MG PO TABS: 500.0000 mg | ORAL_TABLET | Freq: Two times a day (BID) | ORAL | 0 refills | Status: AC

## 2017-06-01 MED ORDER — IOPAMIDOL (ISOVUE-300) INJECTION 61%
100.0000 mL | Freq: Once | INTRAVENOUS | Status: AC | PRN
  Administered 2017-06-01: 100 mL via INTRAVENOUS

## 2017-06-01 NOTE — MAU Provider Note (Signed)
Chief Complaint: No chief complaint on file.   None     SUBJECTIVE HPI: Vanessa Beck is a 36 y.o. G1P1001 who presents to maternity admissions reporting onset of abdominal cramping yesterday in her lower abdomen. Her sister gave her ibuprofen to take, which was helping yesterday. Then, the pain became worse today, and she threw up the ibuprofen she tried this morning.  Her pain is severe, all over her abdomen in the lower and upper part, and associated with rectal pressure like she needs to have a bowel movement. When she tries to go the the bathroom, however, she is unable to go and it causes a lot of pain.  She started having vaginal bleeding yesterday and her LMP was 28 days ago.  There are no other symptoms associated with this pain but she also reports that her eyes are swollen and have been stuck together when she wakes up in the morning. She endorses a lot of crying related to her pain yesterday and today. She denies any eye itching or discharge other than when she wakes up in the morning.  She denies any other respiratory symptoms or cough.  She denies vaginal itching/burning, urinary symptoms, h/a, dizziness, or fever/chills.     HPI  Past Medical History:  Diagnosis Date  . WUJWJXBJ(478.2)    Past Surgical History:  Procedure Laterality Date  . NO PAST SURGERIES     Social History   Social History  . Marital status: Single    Spouse name: N/A  . Number of children: N/A  . Years of education: N/A   Occupational History  . Not on file.   Social History Main Topics  . Smoking status: Current Every Day Smoker    Packs/day: 0.50  . Smokeless tobacco: Never Used  . Alcohol use Yes  . Drug use: No  . Sexual activity: Yes   Other Topics Concern  . Not on file   Social History Narrative  . No narrative on file   No current facility-administered medications on file prior to encounter.    Current Outpatient Prescriptions on File Prior to Encounter  Medication Sig  Dispense Refill  . ibuprofen (ADVIL,MOTRIN) 200 MG tablet Take 400 mg by mouth every 6 (six) hours as needed for mild pain.     No Known Allergies  ROS:  Review of Systems  Constitutional: Negative for chills, fatigue and fever.  Respiratory: Negative for shortness of breath.   Cardiovascular: Negative for chest pain.  Gastrointestinal: Positive for abdominal pain, nausea, rectal pain and vomiting. Negative for constipation and diarrhea.  Genitourinary: Positive for pelvic pain and vaginal bleeding. Negative for difficulty urinating, dysuria, flank pain, vaginal discharge and vaginal pain.  Neurological: Negative for dizziness and headaches.  Psychiatric/Behavioral: Negative.    I have reviewed patient's Past Medical Hx, Surgical Hx, Family Hx, Social Hx, medications and allergies.   Physical Exam   Patient Vitals for the past 24 hrs:  BP Temp Temp src Pulse Resp SpO2  06/01/17 1454 121/79 98.2 F (36.8 C) Oral 74 20 99 %   Constitutional: Well-developed, well-nourished female in moderate distress.  Cardiovascular: normal rate Respiratory: normal effort GI: Abd soft, with generalized tenderness in all 4 quadrants. No rebound tenderness or guarding. Pos BS x 4 MS: Extremities nontender, no edema, normal ROM Neurologic: Alert and oriented x 4.  GU: Neg CVAT.  PELVIC EXAM: Cervix pink, visually closed, without lesion, moderate amount dark red bleeding, vaginal walls and external genitalia normal Bimanual exam:  Cervix 0/long/high, firm, anterior, positive CMT, uterus tender, nonenlarged, adnexa without tenderness, enlargement, or mass   LAB RESULTS Results for orders placed or performed during the hospital encounter of 06/01/17 (from the past 24 hour(s))  Urine rapid drug screen (hosp performed)     Status: Abnormal   Collection Time: 06/01/17  2:58 PM  Result Value Ref Range   Opiates NONE DETECTED NONE DETECTED   Cocaine POSITIVE (A) NONE DETECTED   Benzodiazepines NONE  DETECTED NONE DETECTED   Amphetamines NONE DETECTED NONE DETECTED   Tetrahydrocannabinol POSITIVE (A) NONE DETECTED   Barbiturates NONE DETECTED NONE DETECTED  Urinalysis, Routine w reflex microscopic     Status: Abnormal   Collection Time: 06/01/17  2:58 PM  Result Value Ref Range   Color, Urine YELLOW YELLOW   APPearance CLOUDY (A) CLEAR   Specific Gravity, Urine 1.021 1.005 - 1.030   pH 5.0 5.0 - 8.0   Glucose, UA NEGATIVE NEGATIVE mg/dL   Hgb urine dipstick LARGE (A) NEGATIVE   Bilirubin Urine NEGATIVE NEGATIVE   Ketones, ur NEGATIVE NEGATIVE mg/dL   Protein, ur 30 (A) NEGATIVE mg/dL   Nitrite NEGATIVE NEGATIVE   Leukocytes, UA SMALL (A) NEGATIVE   RBC / HPF TOO NUMEROUS TO COUNT 0 - 5 RBC/hpf   WBC, UA 6-30 0 - 5 WBC/hpf   Bacteria, UA RARE (A) NONE SEEN   Squamous Epithelial / LPF 0-5 (A) NONE SEEN   Mucus PRESENT   CBC     Status: Abnormal   Collection Time: 06/01/17  3:16 PM  Result Value Ref Range   WBC 18.4 (H) 4.0 - 10.5 K/uL   RBC 4.79 3.87 - 5.11 MIL/uL   Hemoglobin 15.2 (H) 12.0 - 15.0 g/dL   HCT 16.1 09.6 - 04.5 %   MCV 92.3 78.0 - 100.0 fL   MCH 31.7 26.0 - 34.0 pg   MCHC 34.4 30.0 - 36.0 g/dL   RDW 40.9 81.1 - 91.4 %   Platelets 283 150 - 400 K/uL  Wet prep, genital     Status: Abnormal   Collection Time: 06/01/17  4:05 PM  Result Value Ref Range   Yeast Wet Prep HPF POC NONE SEEN NONE SEEN   Trich, Wet Prep NONE SEEN NONE SEEN   Clue Cells Wet Prep HPF POC NONE SEEN NONE SEEN   WBC, Wet Prep HPF POC FEW (A) NONE SEEN   Sperm NONE SEEN   Pregnancy, urine POC     Status: None   Collection Time: 06/01/17  4:14 PM  Result Value Ref Range   Preg Test, Ur NEGATIVE NEGATIVE    Report to Donette Larry, CNM, with [place] report pending  Sharen Counter Certified Nurse-Midwife 06/01/2017  5:03 PM  US Transvaginal Non-ob  Result Date: 06/01/2017 CLINICAL DATA:  Acute pelvic pain EXAM: TRANSABDOMINAL AND TRANSVAGINAL ULTRASOUND OF PELVIS TECHNIQUE: Both  transabdominal and transvaginal ultrasound examinations of the pelvis were performed. Transabdominal technique was performed for global imaging of the pelvis including uterus, ovaries, adnexal regions, and pelvic cul-de-sac. It was necessary to proceed with endovaginal exam following the transabdominal exam to visualize the endometrium. COMPARISON:  None FINDINGS: Uterus Measurements: 9.2 x 5 x 6.3 cm. No fibroids or other mass visualized. Uterus is slightly anteverted. Endometrium Thickness: 6.1 mm.  No focal abnormality visualized. Right ovary Measurements: 3.7 x 2.8 x 2.1 cm. Normal appearance/no adnexal mass given slight limitations of adjacent overlying bowel. Left ovary Measurements: 5.9 x 4.6 x 6.4 cm. Hypoechoic complex cyst  possibly representing an involuting cyst measuring 4 x 3.7 x 2.4 cm is identified within the left ovary with slightly crenulated appearing margins. Other findings Trace free fluid in the pelvis. IMPRESSION: A complex left ovarian cyst with crenulated appearing margins and internal debris measuring 4 x 3.7 x 2.4 cm. This may represent an involuting or ruptured cyst. Trace free fluid in the pelvis. Otherwise, the exam is negative. Electronically Signed   By: Tollie Eth M.D.   On: 06/01/2017 18:18   US Pelvis Complete  Result Date: 06/01/2017 CLINICAL DATA:  Acute pelvic pain EXAM: TRANSABDOMINAL AND TRANSVAGINAL ULTRASOUND OF PELVIS TECHNIQUE: Both transabdominal and transvaginal ultrasound examinations of the pelvis were performed. Transabdominal technique was performed for global imaging of the pelvis including uterus, ovaries, adnexal regions, and pelvic cul-de-sac. It was necessary to proceed with endovaginal exam following the transabdominal exam to visualize the endometrium. COMPARISON:  None FINDINGS: Uterus Measurements: 9.2 x 5 x 6.3 cm. No fibroids or other mass visualized. Uterus is slightly anteverted. Endometrium Thickness: 6.1 mm.  No focal abnormality visualized. Right  ovary Measurements: 3.7 x 2.8 x 2.1 cm. Normal appearance/no adnexal mass given slight limitations of adjacent overlying bowel. Left ovary Measurements: 5.9 x 4.6 x 6.4 cm. Hypoechoic complex cyst possibly representing an involuting cyst measuring 4 x 3.7 x 2.4 cm is identified within the left ovary with slightly crenulated appearing margins. Other findings Trace free fluid in the pelvis. IMPRESSION: A complex left ovarian cyst with crenulated appearing margins and internal debris measuring 4 x 3.7 x 2.4 cm. This may represent an involuting or ruptured cyst. Trace free fluid in the pelvis. Otherwise, the exam is negative. Electronically Signed   By: Tollie Eth M.D.   On: 06/01/2017 18:18   Ct Abdomen Pelvis W Contrast  Result Date: 06/01/2017 CLINICAL DATA:  36 year old female with progressive abdominal and pelvic pain for 1 week. EXAM: CT ABDOMEN AND PELVIS WITH CONTRAST TECHNIQUE: Multidetector CT imaging of the abdomen and pelvis was performed using the standard protocol following bolus administration of intravenous contrast. CONTRAST:  ISOVUE-300 IOPAMIDOL (ISOVUE-300) INJECTION 61% COMPARISON:  Pelvis ultrasound 1733 hours today. FINDINGS: Lower chest: 2 small peribronchial pulmonary nodules in the right lower lobe are probably postinflammatory in this age group. The larger is 7 mm (series 4, image 6). Mild respiratory motion artifact. Otherwise negative lung bases. No pleural or pericardial effusion. Hepatobiliary: Negative liver and gallbladder. Pancreas: Negative. Spleen: Negative. Adrenals/Urinary Tract: Normal adrenal glands. Both kidneys are within normal limits. No hydronephrosis or hydroureter. Mildly distended but otherwise unremarkable urinary bladder. Stomach/Bowel: Decompressed and negative rectum. Mild to moderate diverticulosis of the sigmoid colon, and there is an area of confluent mesenteric inflammatory stranding along the distal sigmoid, contiguous with the abnormal left ovary  (series 2, image 72). No extraluminal gas. Oral contrast has reached this segment of the sigmoid colon, but there is no evidence of contrast extension into the left ovary. The diverticulosis continues throughout the more proximal colon with no other active inflammation identified. Normal retrocecal appendix. Negative terminal ileum. No dilated small bowel. Negative stomach and duodenum. No abdominal free fluid identified. Vascular/Lymphatic: Major arterial structures in the abdomen and pelvis are patent. Patent portal venous system. Small retroperitoneal lymph nodes. Reproductive: Abnormal left ovary as seen on the pelvis ultrasound today (series 2, image 79). Given the curvilinear configuration of some of the fluid density in or row on the left ovary, left hydrosalpinx is difficult to exclude. Other: Trace pelvic free fluid. Musculoskeletal:  Straightening of lumbar lordosis. 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 No acute osseous abnormality identified. IMPRESSION: 1. Inflammatory stranding within the sigmoid mesentery, contiguous with the abnormal left ovary, and superimposed on sigmoid diverticulosis. Favor Acute Diverticulitis, and consider secondary involvement and infection of the adjacent left ovary - the worst case scenario of which would be tubo-ovarian abscess. 2. Trace free fluid only in the pelvis. No bowel obstruction or other complicating features. 3. Underlying widespread diverticulosis of the large bowel. 4. Nonspecific but probably postinflammatory right lower lobe pulmonary nodules. Attention can be directed on future CT Abdomen and Pelvis. Electronically Signed   By: Odessa Fleming M.D.   On: 06/01/2017 21:47   Ovarian cyst unlikely etiology for elevated WBC count with left shift. Will order CT of abd/pelvis. Awaiting CT. Transfer of care given to M.Mayford Knife CNM  Donette Larry, CNM  06/01/2017 9:02 PM   A/P: Problem List Items Addressed This Visit    None    Visit Diagnoses     Diverticulitis    -  Primary   Acute pelvic pain, female       Relevant Orders   US Pelvis Complete (Completed)   US Transvaginal Non-OB (Completed)   Left lower quadrant pain         Findings and diagnosis discussed with patient  Will treat for acute diverticulitis with cipro and flagyl; low likelihood of TOA with negative [place] results Vitals are stable Rx for pain medication given Return precautions discussed Handout given Discharge in stable condition  Caryl Ada, DO [facility], [facility] [place] 06/01/2017, 10:35 PM

## 2017-06-01 NOTE — MAU Note (Signed)
abd pain started this morning.  Hx of ovarian cyst.  Doesn't know if there back or more swollen. Took some ibuprofen to try to calm it and she threw it up. Feeling a lot of pressure, like for a bowel movement, yet when she pushes there is a lot of pain.

## 2017-06-01 NOTE — Discharge Instructions (Signed)

## 2017-06-02 LAB — GC/CHLAMYDIA PROBE AMP (~~LOC~~) NOT AT ARMC
CHLAMYDIA, DNA PROBE: NEGATIVE
Neisseria Gonorrhea: NEGATIVE

## 2017-06-02 LAB — HIV ANTIBODY (ROUTINE TESTING W REFLEX): HIV SCREEN 4TH GENERATION: NONREACTIVE

## 2017-06-02 LAB — RPR: RPR: NONREACTIVE

## 2017-06-03 LAB — URINE CULTURE

## 2018-06-20 ENCOUNTER — Encounter (HOSPITAL_COMMUNITY): Payer: Self-pay | Admitting: Emergency Medicine

## 2018-06-20 ENCOUNTER — Emergency Department (HOSPITAL_COMMUNITY)
Admission: EM | Admit: 2018-06-20 | Discharge: 2018-06-20 | Disposition: A | Discharge status: Home / Self Care | Payer: No Typology Code available for payment source | Attending: Emergency Medicine | Admitting: Emergency Medicine

## 2018-06-20 ENCOUNTER — Emergency Department (HOSPITAL_COMMUNITY): Payer: No Typology Code available for payment source

## 2018-06-20 DIAGNOSIS — S5001XA Contusion of right elbow, initial encounter: Secondary | ICD-10-CM | POA: Diagnosis not present

## 2018-06-20 DIAGNOSIS — Y929 Unspecified place or not applicable: Secondary | ICD-10-CM | POA: Insufficient documentation

## 2018-06-20 DIAGNOSIS — R55 Syncope and collapse: Secondary | ICD-10-CM | POA: Diagnosis not present

## 2018-06-20 DIAGNOSIS — S20319A Abrasion of unspecified front wall of thorax, initial encounter: Secondary | ICD-10-CM | POA: Diagnosis not present

## 2018-06-20 DIAGNOSIS — S0181XA Laceration without foreign body of other part of head, initial encounter: Secondary | ICD-10-CM | POA: Insufficient documentation

## 2018-06-20 DIAGNOSIS — F17200 Nicotine dependence, unspecified, uncomplicated: Secondary | ICD-10-CM | POA: Insufficient documentation

## 2018-06-20 DIAGNOSIS — Y999 Unspecified external cause status: Secondary | ICD-10-CM | POA: Insufficient documentation

## 2018-06-20 DIAGNOSIS — Y9389 Activity, other specified: Secondary | ICD-10-CM | POA: Diagnosis not present

## 2018-06-20 DIAGNOSIS — M79605 Pain in left leg: Secondary | ICD-10-CM | POA: Diagnosis not present

## 2018-06-20 DIAGNOSIS — S0990XA Unspecified injury of head, initial encounter: Secondary | ICD-10-CM | POA: Diagnosis present

## 2018-06-20 LAB — I-STAT BETA HCG BLOOD, ED (MC, WL, AP ONLY): I-stat hCG, quantitative: <5 m[IU]/mL (ref <5)

## 2018-06-20 MED ORDER — METHOCARBAMOL 500 MG PO TABS
750.0000 mg | ORAL_TABLET | Freq: Once | ORAL | Status: AC
Start: 2018-06-20 — End: 2018-06-20
  Administered 2018-06-20: 750 mg via ORAL
  Filled 2018-06-20: qty 2

## 2018-06-20 MED ORDER — MORPHINE SULFATE (PF) 2 MG/ML IV SOLN
1.0000 mg | Freq: Once | INTRAVENOUS | Status: DC
Start: 2018-06-20 — End: 2018-06-20

## 2018-06-20 MED ORDER — METHOCARBAMOL 500 MG PO TABS: 500.0000 mg | ORAL_TABLET | Freq: Two times a day (BID) | ORAL | 0 refills | Status: AC

## 2018-06-20 MED ORDER — LORAZEPAM 2 MG/ML IJ SOLN
1.0000 mg | Freq: Once | INTRAMUSCULAR | Status: AC
  Administered 2018-06-20: 1 mg via INTRAVENOUS
  Filled 2018-06-20: qty 1
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
## 2018-06-20 NOTE — Discharge Instructions (Addendum)
I have prescribed muscle relaxers for your pain, please do not drink or drive while taking this medications as they can make you drowsy. You may alternate ibuprofen or tylenol for your pain. Please follow-up with PCP in 1 week for reevaluation of your symptoms.  You experience any bowel or bladder incontinence, fever, worsening in your symptoms please return to the ED.

## 2018-08-27 IMAGING — CT CT ABD-PELV W/ CM
2 of 4 series · 16 of 46 positions shown, 18 images · IV contrast (OMNIPAQUE)
Comparison: Pelvis ultrasound 3322 hours today.

CLINICAL DATA: 36-year-old female with progressive abdominal and
pelvic pain for 1 week.

EXAM:
CT ABDOMEN AND PELVIS WITH CONTRAST
TECHNIQUE: Multidetector CT imaging of the abdomen and pelvis was performed
using the standard protocol following bolus administration of
intravenous contrast.
CONTRAST:  100mL DCUOX9-APP IOPAMIDOL (DCUOX9-APP) INJECTION 61%

[Series 2: routine abdomen/pelvis with · axial · 0.96mm/px · z∈[+576,+1031]mm · 13 of 101 slices shown, 15 images]
[im 5/101  soft-tissue]
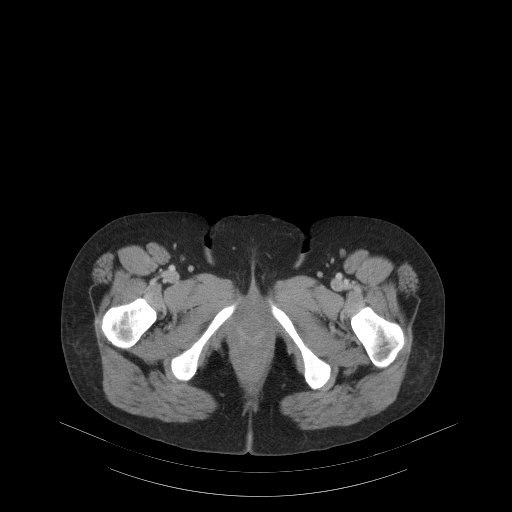
[im 5/101  bone]
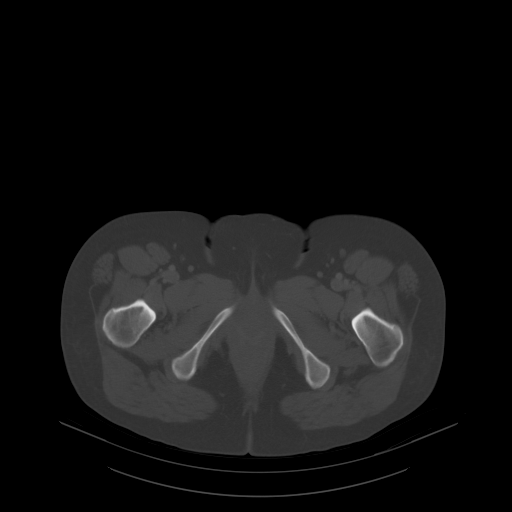
[im 14/101  soft-tissue]
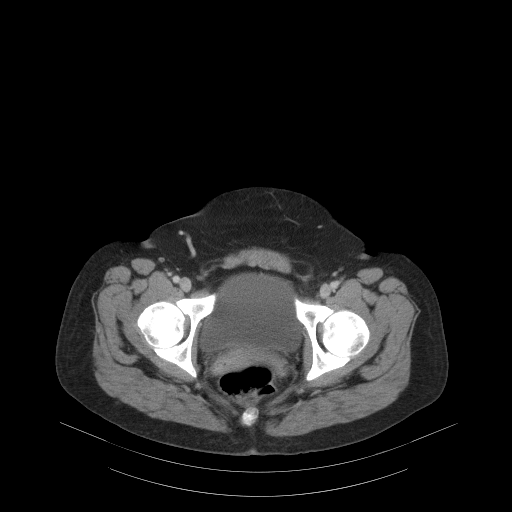
[im 22/101  soft-tissue]
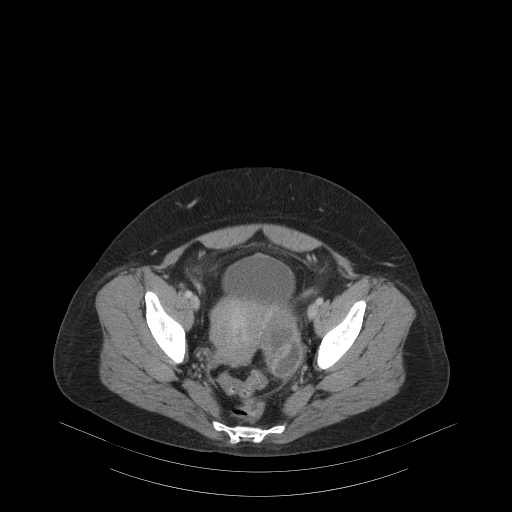
[im 27/101  soft-tissue]
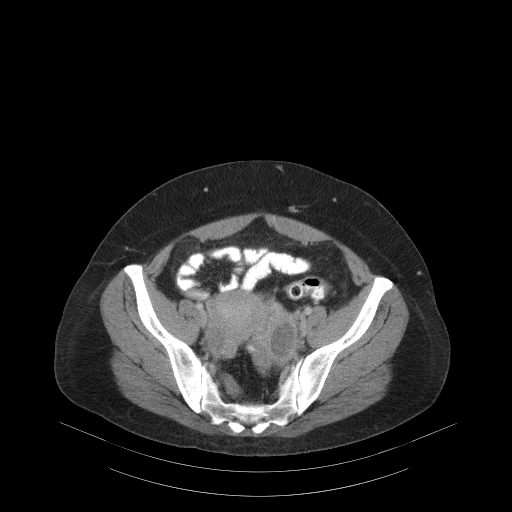
[im 35/101  soft-tissue]
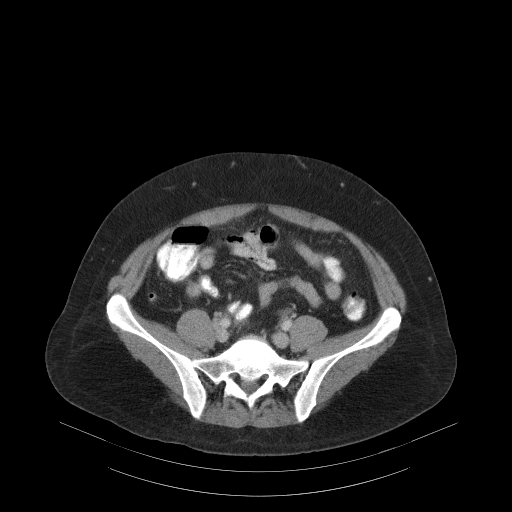
[im 44/101  soft-tissue]
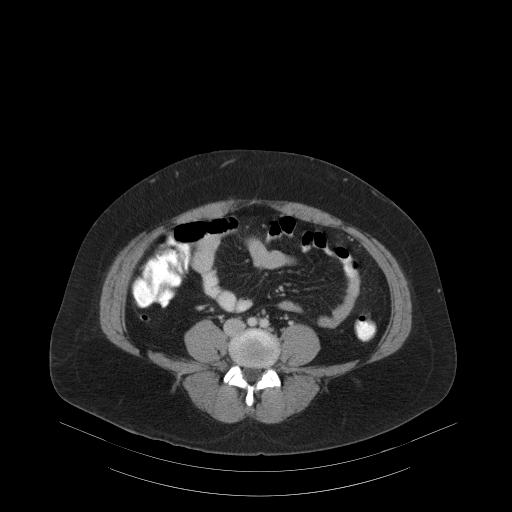
[im 53/101  soft-tissue]
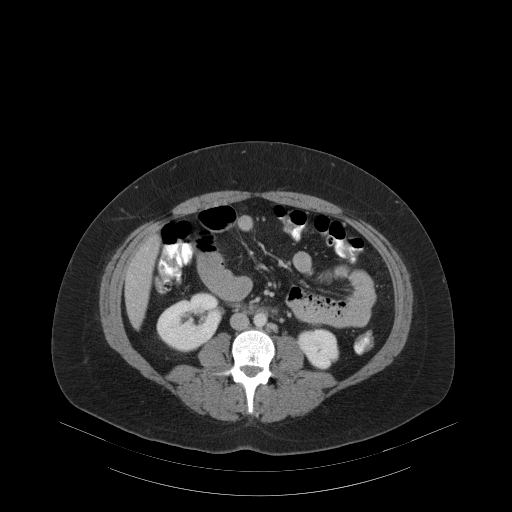
[im 57/101  soft-tissue]
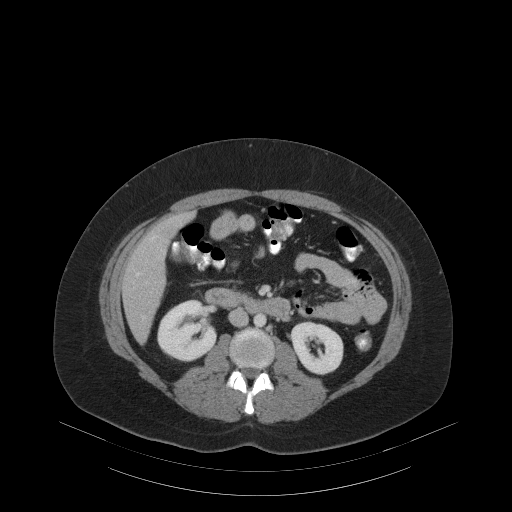
[im 66/101  soft-tissue]
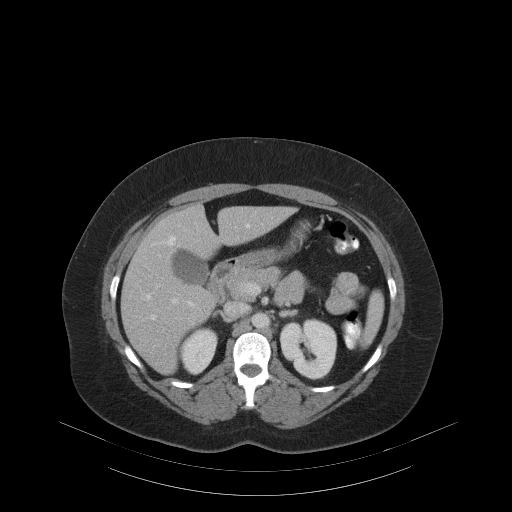
[im 66/101  bone]
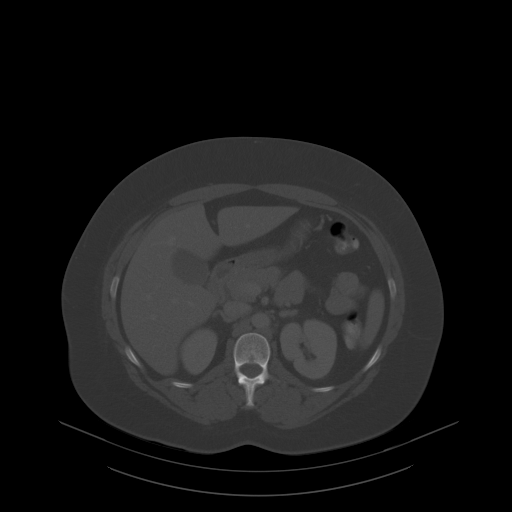
[im 74/101  soft-tissue]
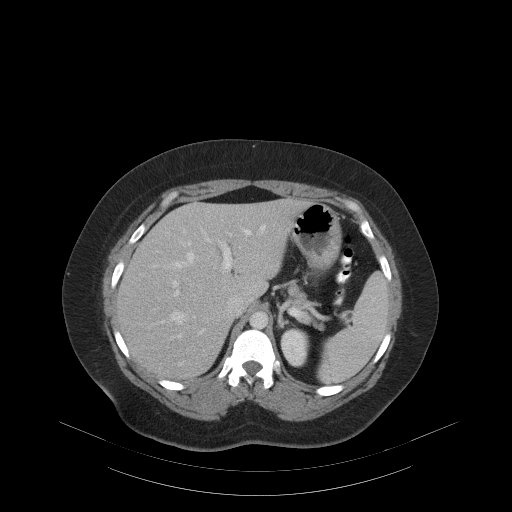
[im 79/101  soft-tissue]
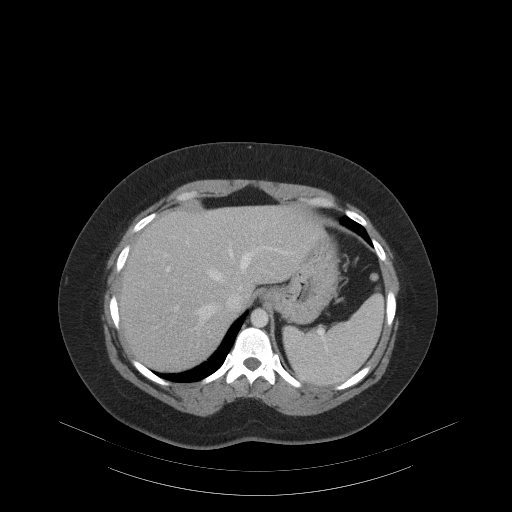
[im 87/101  soft-tissue]
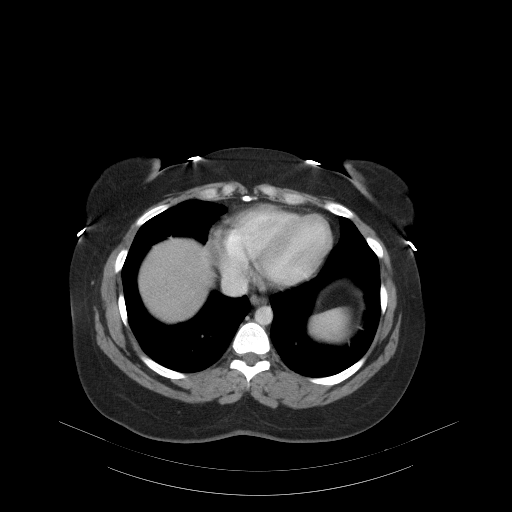
[im 96/101  soft-tissue]
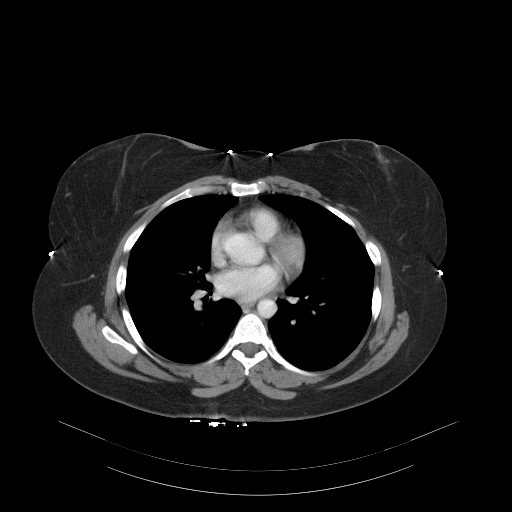

[Series 602: <mpr thick range> · coronal · 0.99mm/px · 3 of 59 slices shown]
[im 20/59  soft-tissue]
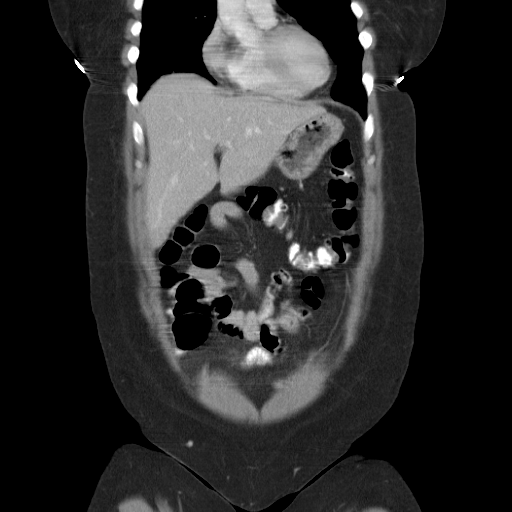
[im 26/59  soft-tissue]
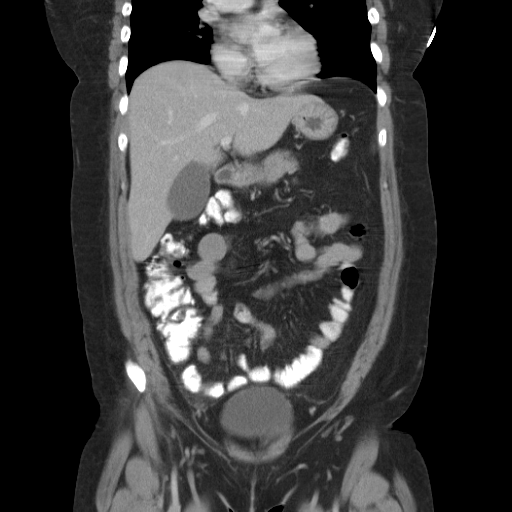
[im 33/59  soft-tissue]
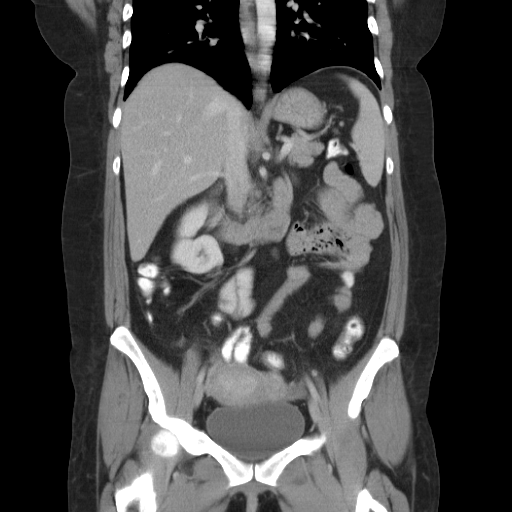

[16 of 46 positions shown; findings below may reference images not displayed]

FINDINGS: Lower chest: 2 small peribronchial pulmonary nodules in the right
lower lobe are probably postinflammatory in this age group. The
larger is 7 mm (series 4, image 6). Mild respiratory motion
artifact. Otherwise negative lung bases. No pleural or pericardial
effusion.

Hepatobiliary: Negative liver and gallbladder.

Pancreas: Negative.

Spleen: Negative.

Adrenals/Urinary Tract: Normal adrenal glands.

Both kidneys are within normal limits. No hydronephrosis or
hydroureter.

Mildly distended but otherwise unremarkable urinary bladder.

Stomach/Bowel: Decompressed and negative rectum. Mild to moderate
diverticulosis of the sigmoid colon, and there is an area of
confluent mesenteric inflammatory stranding along the distal
sigmoid, contiguous with the abnormal left ovary (series 2, image
72). No extraluminal gas. Oral contrast has reached this segment of
the sigmoid colon, but there is no evidence of contrast extension
into the left ovary.

The diverticulosis continues throughout the more proximal colon with
no other active inflammation identified. Normal retrocecal appendix.
Negative terminal ileum. No dilated small bowel. Negative stomach
and duodenum.

No abdominal free fluid identified.

Vascular/Lymphatic: Major arterial structures in the abdomen and
pelvis are patent. Patent portal venous system.

Small retroperitoneal lymph nodes.

Reproductive: Abnormal left ovary as seen on the pelvis ultrasound
today (series 2, image 79). Given the curvilinear configuration of
some of the fluid density in or row on the left ovary, left
hydrosalpinx is difficult to exclude.

Other: Trace pelvic free fluid.

Musculoskeletal: Straightening of lumbar lordosis. Congenital short
pedicles in the lumbar spine. No acute osseous abnormality
identified.
IMPRESSION: 1. Inflammatory stranding within the sigmoid mesentery, contiguous
with the abnormal left ovary, and superimposed on sigmoid
diverticulosis.
Favor Acute Diverticulitis, and consider secondary involvement and
infection of the adjacent left ovary - the worst case scenario of
which would be tubo-ovarian abscess.
2. Trace free fluid only in the pelvis. No bowel obstruction or
other complicating features.
3. Underlying widespread diverticulosis of the large bowel.
4. Nonspecific but probably postinflammatory right lower lobe
pulmonary nodules. Attention can be directed on future CT Abdomen
and Pelvis.

## 2022-06-13 ENCOUNTER — Other Ambulatory Visit: Payer: Self-pay

## 2022-06-13 ENCOUNTER — Emergency Department (HOSPITAL_COMMUNITY)
Admission: EM | Admit: 2022-06-13 | Discharge: 2022-06-13 | Discharge status: Against Medical Advice | Payer: Medicaid Other | Source: Home / Self Care

## 2022-06-13 ENCOUNTER — Ambulatory Visit (HOSPITAL_COMMUNITY)
Admission: EM | Admit: 2022-06-13 | Discharge: 2022-06-13 | Disposition: A | Discharge status: Home / Self Care | Payer: Self-pay

## 2022-06-13 ENCOUNTER — Encounter (HOSPITAL_COMMUNITY): Payer: Self-pay

## 2022-06-13 DIAGNOSIS — S069X9A Unspecified intracranial injury with loss of consciousness of unspecified duration, initial encounter: Secondary | ICD-10-CM

## 2022-06-13 DIAGNOSIS — R413 Other amnesia: Secondary | ICD-10-CM
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
## 2022-06-13 NOTE — Discharge Instructions (Addendum)
D/C to ED via POV 
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
## 2022-06-13 NOTE — ED Provider Notes (Deleted)
Opened in error. Disregard.   Vanessa Beck 06/13/22 1951

## 2023-02-10 NOTE — Progress Notes (Unsigned)
  Subjective:    Vanessa Beck - 42 y.o. female MRN [id number]  Date of birth: 11-19-1980  HPI  Vanessa Beck is to establish care.   Current issues and/or concerns:  ROS per HPI     Health Maintenance:  Health Maintenance Due  Topic Date Due   COVID-19 Vaccine (1) Never done   Hepatitis C Screening  Never done   DTaP/Tdap/Td (1 - Tdap) Never done   PAP SMEAR-Modifier  Never done     Past Medical History: There are no problems to display for this patient.     Social History   reports that she has been smoking. She has been smoking an average of .5 packs per day. She has never used smokeless tobacco. She reports current alcohol use. She reports that she does not use drugs.   Family History  family history includes Diabetes in her father; Hypertension in her mother.   Medications: reviewed and updated   Objective:   Physical Exam There were no vitals taken for this visit. Physical Exam      Assessment & Plan:         Patient was given clear instructions to go to Emergency Department or return to medical center if symptoms don't improve, worsen, or new problems develop.The patient verbalized understanding.  I discussed the assessment and treatment plan with the patient. The patient was provided an opportunity to ask questions and all were answered. The patient agreed with the plan and demonstrated an understanding of the instructions.   The patient was advised to call back or seek an in-person evaluation if the symptoms worsen or if the condition fails to improve as anticipated.    Ricky Stabs, NP 02/10/2023, 9:06 AM Primary Care at [facility]

## 2023-02-11 ENCOUNTER — Telehealth: Payer: Self-pay | Admitting: Family

## 2023-02-11 ENCOUNTER — Encounter: Payer: Commercial Managed Care - HMO | Admitting: Family

## 2023-02-11 DIAGNOSIS — Z7689 Persons encountering health services in other specified circumstances: Secondary | ICD-10-CM

## 2023-02-11 NOTE — Telephone Encounter (Signed)
Called Pt to reschedule an apt but pt did not answer. Voicemail full, could not leave a message.

## 2024-09-20 ENCOUNTER — Ambulatory Visit: Payer: Self-pay
